# Patient Record
Sex: Male | Born: 1955 | Race: White | Hispanic: No | Marital: Married | Smoking: Current some day smoker
Health system: Southern US, Community
[De-identification: ages and names within clinical notes are randomized; demographics above are authoritative.]

## PROBLEM LIST (undated history)

## (undated) DIAGNOSIS — I10 Essential (primary) hypertension: Secondary | ICD-10-CM

## (undated) DIAGNOSIS — F419 Anxiety disorder, unspecified: Secondary | ICD-10-CM

## (undated) DIAGNOSIS — F329 Major depressive disorder, single episode, unspecified: Secondary | ICD-10-CM

## (undated) DIAGNOSIS — J449 Chronic obstructive pulmonary disease, unspecified: Secondary | ICD-10-CM

## (undated) DIAGNOSIS — F32A Depression, unspecified: Secondary | ICD-10-CM

## (undated) DIAGNOSIS — E119 Type 2 diabetes mellitus without complications: Secondary | ICD-10-CM

## (undated) DIAGNOSIS — J45909 Unspecified asthma, uncomplicated: Secondary | ICD-10-CM

## (undated) DIAGNOSIS — I219 Acute myocardial infarction, unspecified: Secondary | ICD-10-CM

## (undated) DIAGNOSIS — K859 Acute pancreatitis without necrosis or infection, unspecified: Secondary | ICD-10-CM

## (undated) HISTORY — PX: HERNIA REPAIR: SHX51

## (undated) HISTORY — PX: NECK SURGERY: SHX720

## (undated) HISTORY — PX: SALIVARY GLAND SURGERY: SHX768

---

## 2002-08-07 ENCOUNTER — Encounter: Payer: Self-pay | Admitting: Unknown Physician Specialty

## 2002-08-07 ENCOUNTER — Encounter: Admission: RE | Admit: 2002-08-07 | Discharge: 2002-08-07 | Payer: Self-pay | Admitting: Unknown Physician Specialty

## 2004-11-26 ENCOUNTER — Ambulatory Visit: Payer: Self-pay | Admitting: Internal Medicine

## 2005-11-17 ENCOUNTER — Other Ambulatory Visit: Payer: Self-pay

## 2005-11-17 ENCOUNTER — Emergency Department: Payer: Self-pay | Admitting: Internal Medicine

## 2007-06-08 ENCOUNTER — Encounter: Payer: Self-pay | Admitting: Unknown Physician Specialty

## 2007-06-17 ENCOUNTER — Encounter: Payer: Self-pay | Admitting: Unknown Physician Specialty

## 2007-07-11 ENCOUNTER — Encounter: Admission: RE | Admit: 2007-07-11 | Discharge: 2007-07-11 | Payer: Self-pay | Admitting: Unknown Physician Specialty

## 2007-07-18 ENCOUNTER — Encounter: Payer: Self-pay | Admitting: Unknown Physician Specialty

## 2008-07-04 ENCOUNTER — Emergency Department: Payer: Self-pay | Admitting: Emergency Medicine

## 2008-07-10 ENCOUNTER — Emergency Department: Payer: Self-pay | Admitting: Emergency Medicine

## 2009-03-13 ENCOUNTER — Inpatient Hospital Stay: Payer: Self-pay | Admitting: Unknown Physician Specialty

## 2010-11-27 ENCOUNTER — Inpatient Hospital Stay: Payer: Self-pay | Admitting: Psychiatry

## 2010-11-27 DIAGNOSIS — R079 Chest pain, unspecified: Secondary | ICD-10-CM

## 2011-01-20 ENCOUNTER — Emergency Department: Payer: Self-pay | Admitting: *Deleted

## 2012-03-01 ENCOUNTER — Ambulatory Visit: Payer: Self-pay | Admitting: Unknown Physician Specialty

## 2012-03-03 LAB — PATHOLOGY REPORT

## 2014-04-03 DIAGNOSIS — I10 Essential (primary) hypertension: Secondary | ICD-10-CM | POA: Insufficient documentation

## 2014-04-03 DIAGNOSIS — K219 Gastro-esophageal reflux disease without esophagitis: Secondary | ICD-10-CM | POA: Insufficient documentation

## 2014-04-03 DIAGNOSIS — F419 Anxiety disorder, unspecified: Secondary | ICD-10-CM | POA: Insufficient documentation

## 2014-04-03 DIAGNOSIS — E119 Type 2 diabetes mellitus without complications: Secondary | ICD-10-CM | POA: Insufficient documentation

## 2014-06-20 ENCOUNTER — Ambulatory Visit: Payer: Self-pay | Admitting: Orthopedic Surgery

## 2015-06-12 DIAGNOSIS — F3340 Major depressive disorder, recurrent, in remission, unspecified: Secondary | ICD-10-CM | POA: Insufficient documentation

## 2018-01-26 ENCOUNTER — Encounter: Payer: Self-pay | Admitting: Emergency Medicine

## 2018-01-26 ENCOUNTER — Emergency Department
Admission: EM | Admit: 2018-01-26 | Discharge: 2018-01-26 | Disposition: A | Discharge status: Home / Self Care | Payer: Medicare HMO | Attending: Student in an Organized Health Care Education/Training Program | Admitting: Student in an Organized Health Care Education/Training Program

## 2018-01-26 ENCOUNTER — Emergency Department: Payer: Medicare HMO

## 2018-01-26 ENCOUNTER — Other Ambulatory Visit: Payer: Self-pay

## 2018-01-26 DIAGNOSIS — R1011 Right upper quadrant pain: Secondary | ICD-10-CM | POA: Diagnosis not present

## 2018-01-26 DIAGNOSIS — Z87891 Personal history of nicotine dependence: Secondary | ICD-10-CM | POA: Insufficient documentation

## 2018-01-26 DIAGNOSIS — J449 Chronic obstructive pulmonary disease, unspecified: Secondary | ICD-10-CM | POA: Insufficient documentation

## 2018-01-26 DIAGNOSIS — K852 Alcohol induced acute pancreatitis without necrosis or infection: Secondary | ICD-10-CM | POA: Insufficient documentation

## 2018-01-26 DIAGNOSIS — E119 Type 2 diabetes mellitus without complications: Secondary | ICD-10-CM | POA: Diagnosis not present

## 2018-01-26 DIAGNOSIS — R1013 Epigastric pain: Secondary | ICD-10-CM | POA: Diagnosis present

## 2018-01-26 DIAGNOSIS — I10 Essential (primary) hypertension: Secondary | ICD-10-CM | POA: Diagnosis not present

## 2018-01-26 HISTORY — DX: Depression, unspecified: F32.A

## 2018-01-26 HISTORY — DX: Chronic obstructive pulmonary disease, unspecified: J44.9

## 2018-01-26 HISTORY — DX: Type 2 diabetes mellitus without complications: E11.9

## 2018-01-26 HISTORY — DX: Anxiety disorder, unspecified: F41.9

## 2018-01-26 HISTORY — DX: Major depressive disorder, single episode, unspecified: F32.9

## 2018-01-26 HISTORY — DX: Unspecified asthma, uncomplicated: J45.909

## 2018-01-26 HISTORY — DX: Essential (primary) hypertension: I10

## 2018-01-26 LAB — URINALYSIS, COMPLETE (UACMP) WITH MICROSCOPIC
BILIRUBIN URINE: NEGATIVE
Bacteria, UA: NONE SEEN
GLUCOSE, UA: NEGATIVE mg/dL
HGB URINE DIPSTICK: NEGATIVE
KETONES UR: 5 mg/dL — AB
LEUKOCYTES UA: NEGATIVE
NITRITE: NEGATIVE
PH: 5 (ref 5.0–8.0)
Protein, ur: NEGATIVE mg/dL
Specific Gravity, Urine: 1.024 (ref 1.005–1.030)
Squamous Epithelial / LPF: NONE SEEN (ref 0–5)

## 2018-01-26 LAB — CBC
HCT: 43 % (ref 40.0–52.0)
HEMOGLOBIN: 14.9 g/dL (ref 13.0–18.0)
MCH: 32.9 pg (ref 26.0–34.0)
MCHC: 34.7 g/dL (ref 32.0–36.0)
MCV: 94.8 fL (ref 80.0–100.0)
PLATELETS: 231 10*3/uL (ref 150–440)
RBC: 4.54 MIL/uL (ref 4.40–5.90)
RDW: 13.6 % (ref 11.5–14.5)
WBC: 11.3 10*3/uL — ABNORMAL HIGH (ref 3.8–10.6)

## 2018-01-26 LAB — COMPREHENSIVE METABOLIC PANEL
ALT: 24 U/L (ref 0–44)
AST: 24 U/L (ref 15–41)
Albumin: 4.4 g/dL (ref 3.5–5.0)
Alkaline Phosphatase: 62 U/L (ref 38–126)
Anion gap: 10 (ref 5–15)
BILIRUBIN TOTAL: 0.7 mg/dL (ref 0.3–1.2)
BUN: 14 mg/dL (ref 6–20)
CHLORIDE: 103 mmol/L (ref 98–111)
CO2: 25 mmol/L (ref 22–32)
Calcium: 9.6 mg/dL (ref 8.9–10.3)
Creatinine, Ser: 0.86 mg/dL (ref 0.61–1.24)
GFR calc Af Amer: >60 mL/min (ref >60)
GFR calc non Af Amer: >60 mL/min (ref >60)
GLUCOSE: 114 mg/dL — AB (ref 70–99)
POTASSIUM: 4.2 mmol/L (ref 3.5–5.1)
SODIUM: 138 mmol/L (ref 135–145)
TOTAL PROTEIN: 7.7 g/dL (ref 6.5–8.1)

## 2018-01-26 LAB — LIPASE, BLOOD: Lipase: 214 U/L — ABNORMAL HIGH (ref 11–51)

## 2018-01-26 MED ORDER — MORPHINE SULFATE (PF) 4 MG/ML IV SOLN
4.0000 mg | INTRAVENOUS | Status: DC | PRN
  Administered 2018-01-26: 4 mg via INTRAVENOUS
  Filled 2018-01-26: qty 1

## 2018-01-26 MED ORDER — IOHEXOL 300 MG/ML  SOLN
100.0000 mL | Freq: Once | INTRAMUSCULAR | Status: AC | PRN
  Administered 2018-01-26: 100 mL via INTRAVENOUS
  Filled 2018-01-26: qty 100

## 2018-01-26 MED ORDER — HYDROCODONE-ACETAMINOPHEN 5-325 MG PO TABS: 1.0000 | ORAL_TABLET | ORAL | 0 refills | Status: DC | PRN

## 2018-01-26 MED ORDER — PROMETHAZINE HCL 25 MG/ML IJ SOLN
12.5000 mg | Freq: Four times a day (QID) | INTRAMUSCULAR | Status: DC | PRN
  Administered 2018-01-26: 12.5 mg via INTRAVENOUS
  Filled 2018-01-26: qty 1

## 2018-01-26 MED ORDER — SODIUM CHLORIDE 0.9 % IV BOLUS
1000.0000 mL | Freq: Once | INTRAVENOUS | Status: AC
  Administered 2018-01-26: 1000 mL via INTRAVENOUS

## 2018-01-26 MED ORDER — PROCHLORPERAZINE MALEATE 10 MG PO TABS: 10.0000 mg | ORAL_TABLET | Freq: Four times a day (QID) | ORAL | 0 refills | Status: DC | PRN

## 2018-01-26 NOTE — ED Notes (Signed)
Patient transported to CT 

## 2018-01-26 NOTE — ED Provider Notes (Signed)
[facility] Emergency Department Provider Note    First MD Initiated Contact with Patient 01/26/18 1849     (approximate)  I have reviewed the triage vital signs and the nursing notes.   HISTORY  Chief Complaint Abdominal Pain and Back Pain    HPI Joshua Delacruz is a 62 y.o. male with below listed past medical history as well as history of alcohol use dependence presents the ER with chief complaint of midepigastric pain radiating to the right upper quadrant and right side of his back over the past several days.  Denies any fevers.  Does have nausea with this but has not been vomiting.  States his symptoms were not getting any better so he decided to come to the ER tonight.  He is been taking meloxicam at home without any improvement.  Denies any chest pain or shortness of breath.  No fevers.  The pain is mild to moderate in severity.    Past Medical History:  Diagnosis Date  . Anxiety   . Asthma   . COPD (chronic obstructive pulmonary disease) (HCC)   . Depression   . Diabetes mellitus without complication (HCC)   . Hypertension    No family history on file. Past Surgical History:  Procedure Laterality Date  . HERNIA REPAIR     There are no active problems to display for this patient.     Prior to Admission medications   Not on File    Allergies Patient has no known allergies.    Social History Social History   Tobacco Use  . Smoking status: Former Games developer  . Smokeless tobacco: Never Used  Substance Use Topics  . Alcohol use: Yes    Comment: occasional  . Drug use: Yes    Types: Marijuana    Review of Systems Patient denies headaches, rhinorrhea, blurry vision, numbness, shortness of breath, chest pain, edema, cough, abdominal pain, nausea, vomiting, diarrhea, dysuria, fevers, rashes or hallucinations unless otherwise stated above in HPI. ____________________________________________   PHYSICAL EXAM:  VITAL SIGNS: Vitals:     01/26/18 1711 01/26/18 1851  BP: (!) 153/103 (!) 154/99  Pulse: 83 74  Resp: 18 18  Temp: 98.3 F (36.8 C)   SpO2: 97% 98%    Constitutional: Alert and oriented.  Eyes: Conjunctivae are normal.  Head: Atraumatic. Nose: No congestion/rhinnorhea. Mouth/Throat: Mucous membranes are moist.   Neck: No stridor. Painless ROM.  Cardiovascular: Normal rate, regular rhythm. Grossly normal heart sounds.  Good peripheral circulation. Respiratory: Normal respiratory effort.  No retractions. Lungs CTAB. Gastrointestinal: Soft and nontender. No distention. No abdominal bruits. No CVA tenderness. Genitourinary:  Musculoskeletal: No lower extremity tenderness nor edema.  No joint effusions. Neurologic:  Normal speech and language. No gross focal neurologic deficits are appreciated. No facial droop Skin:  Skin is warm, dry and intact. No rash noted. Psychiatric: Mood and affect are normal. Speech and behavior are normal.  ____________________________________________   LABS (all labs ordered are listed, but only abnormal results are displayed)  Results for orders placed or performed during the hospital encounter of 01/26/18 (from the past 24 hour(s))  Lipase, blood     Status: Abnormal   Collection Time: 01/26/18  5:21 PM  Result Value Ref Range   Lipase 214 (H) 11 - 51 U/L  Comprehensive metabolic panel     Status: Abnormal   Collection Time: 01/26/18  5:21 PM  Result Value Ref Range   Sodium 138 135 - 145 mmol/L  Potassium 4.2 3.5 - 5.1 mmol/L   Chloride 103 98 - 111 mmol/L   CO2 25 22 - 32 mmol/L   Glucose, Bld 114 (H) 70 - 99 mg/dL   BUN 14 6 - 20 mg/dL   Creatinine, Ser 1.610.86 0.61 - 1.24 mg/dL   Calcium 9.6 8.9 - 09.610.3 mg/dL   Total Protein 7.7 6.5 - 8.1 g/dL   Albumin 4.4 3.5 - 5.0 g/dL   AST 24 15 - 41 U/L   ALT 24 0 - 44 U/L   Alkaline Phosphatase 62 38 - 126 U/L   Total Bilirubin 0.7 0.3 - 1.2 mg/dL   GFR calc non Af Amer >60 >60 mL/min   GFR calc Af Amer >60 >60 mL/min    Anion gap 10 5 - 15  CBC     Status: Abnormal   Collection Time: 01/26/18  5:21 PM  Result Value Ref Range   WBC 11.3 (H) 3.8 - 10.6 K/uL   RBC 4.54 4.40 - 5.90 MIL/uL   Hemoglobin 14.9 13.0 - 18.0 g/dL   HCT 04.543.0 40.940.0 - 81.152.0 %   MCV 94.8 80.0 - 100.0 fL   MCH 32.9 26.0 - 34.0 pg   MCHC 34.7 32.0 - 36.0 g/dL   RDW 91.413.6 78.211.5 - 95.614.5 %   Platelets 231 150 - 440 K/uL  Urinalysis, Complete w Microscopic     Status: Abnormal   Collection Time: 01/26/18  5:21 PM  Result Value Ref Range   Color, Urine YELLOW (A) YELLOW   APPearance CLEAR (A) CLEAR   Specific Gravity, Urine 1.024 1.005 - 1.030   pH 5.0 5.0 - 8.0   Glucose, UA NEGATIVE NEGATIVE mg/dL   Hgb urine dipstick NEGATIVE NEGATIVE   Bilirubin Urine NEGATIVE NEGATIVE   Ketones, ur 5 (A) NEGATIVE mg/dL   Protein, ur NEGATIVE NEGATIVE mg/dL   Nitrite NEGATIVE NEGATIVE   Leukocytes, UA NEGATIVE NEGATIVE   RBC / HPF 0-5 0 - 5 RBC/hpf   WBC, UA 0-5 0 - 5 WBC/hpf   Bacteria, UA NONE SEEN NONE SEEN   Squamous Epithelial / LPF NONE SEEN 0 - 5   Mucus PRESENT    ____________________________________________ ____________________________________________  RADIOLOGY  I personally reviewed all radiographic images ordered to evaluate for the above acute complaints and reviewed radiology reports and findings.  These findings were personally discussed with the patient.  Please see medical record for radiology report.  ____________________________________________   PROCEDURES  Procedure(s) performed:  Procedures    Critical Care performed: no ____________________________________________   INITIAL IMPRESSION / ASSESSMENT AND PLAN / ED COURSE  Pertinent labs & imaging results that were available during my care of the patient were reviewed by me and considered in my medical decision making (see chart for details).   DDX: Pancreatitis, gallstone pancreatitis, cholelithiasis, gastritis, dehydration, colitis  Joshua Delacruz is a 62  y.o. who presents to the ED with symptoms as described above.  Patient is AFVSS in ED. Exam as above. Given current presentation have considered the above differential.  Blood work does show evidence of elevated lipase and given his alcohol use I am concerned for pancreatitis therefore CT imaging will be ordered for the above differential.  Will provide IV fluids as well as IV pain medication  Clinical Course as of Jan 27 2147  Tue Jan 26, 2018  2124 dsicussed results of blood work as well as CT imaging with patient.  States that his pain is improving would like to try something  to eat or drink.   [PR]  2147 Patient reassessed.  He is tolerating oral hydration.  States his pain is controlled.  At this point do believe he stable and appropriate for outpatient follow-up.  We discussed signs and symptoms for which he should return immediately to hospital.Have discussed with the patient and available family all diagnostics and treatments performed thus far and all questions were answered to the best of my ability. The patient demonstrates understanding and agreement with plan.    [PR]    Clinical Course User Index [PR] Willy Eddyobinson, Veola Cafaro, MD     As part of my medical decision making, I reviewed the following data within the electronic MEDICAL RECORD NUMBER Nursing notes reviewed and incorporated, Labs reviewed, notes from prior ED visits.  ____________________________________________   FINAL CLINICAL IMPRESSION(S) / ED DIAGNOSES  Final diagnoses:  Alcohol-induced acute pancreatitis without infection or necrosis      NEW MEDICATIONS STARTED DURING THIS VISIT:  New Prescriptions   No medications on file     Note:  This document was prepared using Dragon voice recognition software and may include unintentional dictation errors.    Willy Eddyobinson, Yuriko Portales, MD 01/26/18 2152

## 2018-01-26 NOTE — ED Triage Notes (Signed)
Patient reports abdominal pain and back pain with nausea x3 days. Denies vomiting, diarrhea. Patients reports increased frequency of urination. Denies history of kidney stones

## 2018-01-26 NOTE — ED Notes (Signed)
Pt given sandwich tray and diet lemon lime shasta, per request for PO challenge from EDP.

## 2018-01-26 NOTE — Discharge Instructions (Addendum)

## 2018-01-26 NOTE — ED Notes (Signed)
ED Provider at bedside. 

## 2018-01-28 ENCOUNTER — Telehealth: Payer: Self-pay | Admitting: Gastroenterology

## 2018-01-28 NOTE — Telephone Encounter (Signed)
LVM for patient to call and schedule an ED follow up with Dr. Allegra LaiVanga.

## 2018-02-14 DIAGNOSIS — K760 Fatty (change of) liver, not elsewhere classified: Secondary | ICD-10-CM | POA: Insufficient documentation

## 2018-02-14 DIAGNOSIS — I700 Atherosclerosis of aorta: Secondary | ICD-10-CM | POA: Insufficient documentation

## 2018-02-17 ENCOUNTER — Other Ambulatory Visit: Payer: Self-pay | Admitting: Student

## 2018-02-17 DIAGNOSIS — K859 Acute pancreatitis without necrosis or infection, unspecified: Secondary | ICD-10-CM

## 2018-02-17 DIAGNOSIS — R748 Abnormal levels of other serum enzymes: Secondary | ICD-10-CM

## 2018-03-01 ENCOUNTER — Ambulatory Visit
Admission: RE | Admit: 2018-03-01 | Discharge: 2018-03-01 | Disposition: A | Discharge status: Home / Self Care | Payer: Medicare HMO | Source: Ambulatory Visit | Attending: Student | Admitting: Student

## 2018-03-01 ENCOUNTER — Other Ambulatory Visit: Payer: Self-pay | Admitting: Student

## 2018-03-01 DIAGNOSIS — R748 Abnormal levels of other serum enzymes: Secondary | ICD-10-CM | POA: Insufficient documentation

## 2018-03-01 DIAGNOSIS — K859 Acute pancreatitis without necrosis or infection, unspecified: Secondary | ICD-10-CM | POA: Diagnosis present

## 2018-03-01 DIAGNOSIS — K760 Fatty (change of) liver, not elsewhere classified: Secondary | ICD-10-CM | POA: Insufficient documentation

## 2018-03-01 MED ORDER — GADOBENATE DIMEGLUMINE 529 MG/ML IV SOLN
20.0000 mL | Freq: Once | INTRAVENOUS | Status: AC | PRN
  Administered 2018-03-01: 18 mL via INTRAVENOUS

## 2019-02-23 DIAGNOSIS — Z0000 Encounter for general adult medical examination without abnormal findings: Secondary | ICD-10-CM | POA: Insufficient documentation

## 2019-02-23 DIAGNOSIS — J45909 Unspecified asthma, uncomplicated: Secondary | ICD-10-CM | POA: Insufficient documentation

## 2020-06-20 IMAGING — CT CT ABD-PELV W/ CM
2 of 5 series · 15 of 46 positions shown, 17 images · IV contrast (APPLIED)
Comparison: None.

CLINICAL DATA: Abdominal pain with nausea.  Dysuria

EXAM:
CT ABDOMEN AND PELVIS WITH CONTRAST
TECHNIQUE: Multidetector CT imaging of the abdomen and pelvis was performed
using the standard protocol following bolus administration of
intravenous contrast.
CONTRAST:  100mL OMNIPAQUE IOHEXOL 300 MG/ML  SOLN

[Series 2: axial st · axial · 0.76mm/px · z∈[-568,-93]mm · 12 of 109 slices shown, 14 images]
[im 7/109  soft-tissue]
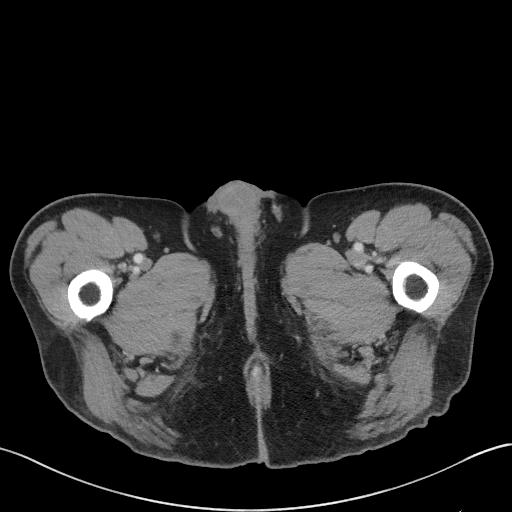
[im 7/109  bone]
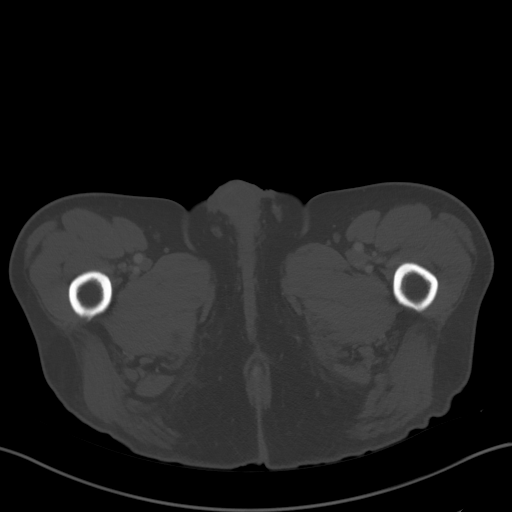
[im 20/109  soft-tissue]
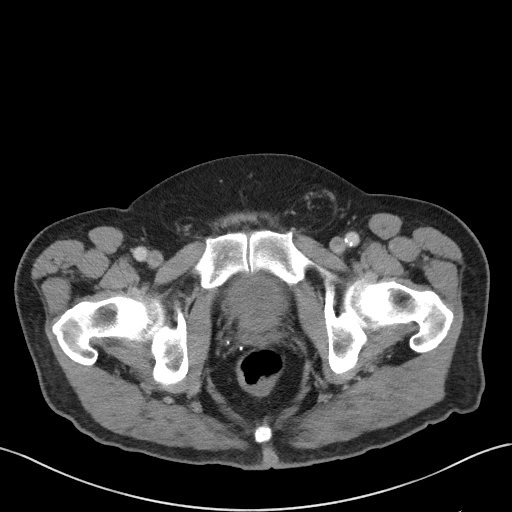
[im 26/109  soft-tissue]
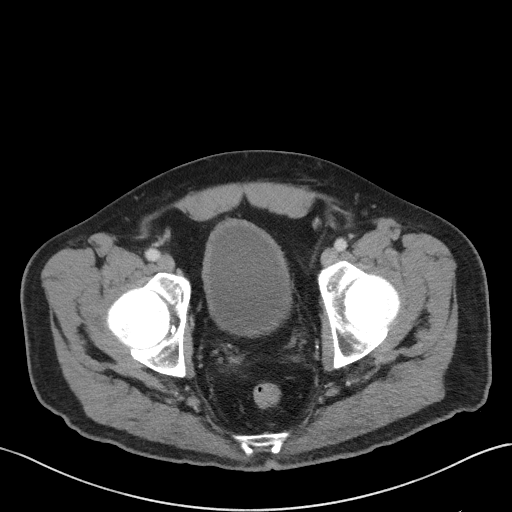
[im 32/109  soft-tissue]
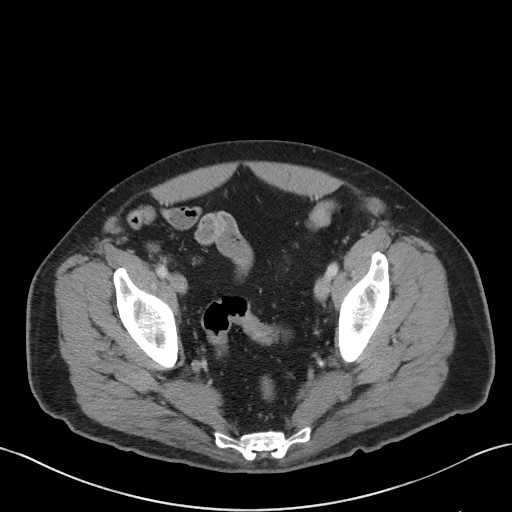
[im 45/109  soft-tissue]
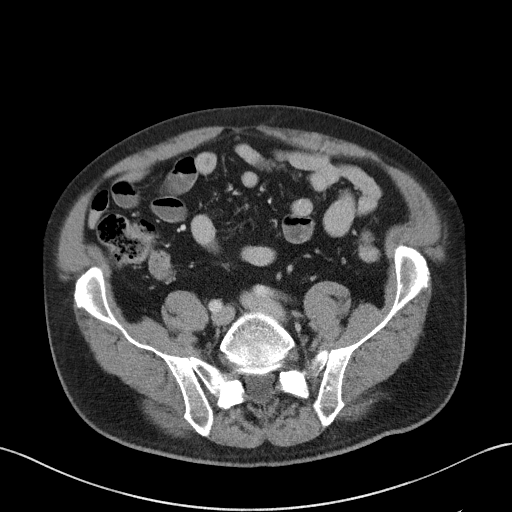
[im 51/109  soft-tissue]
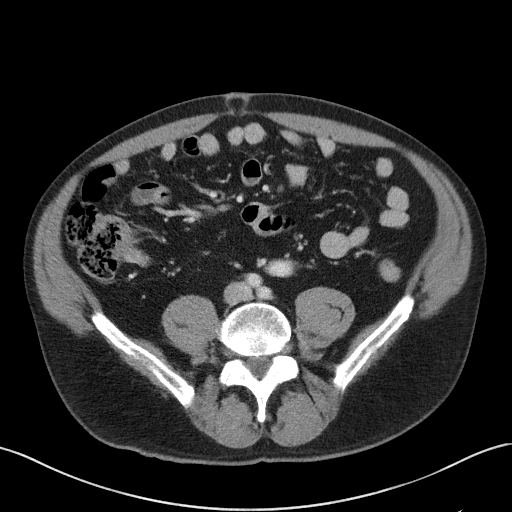
[im 58/109  soft-tissue]
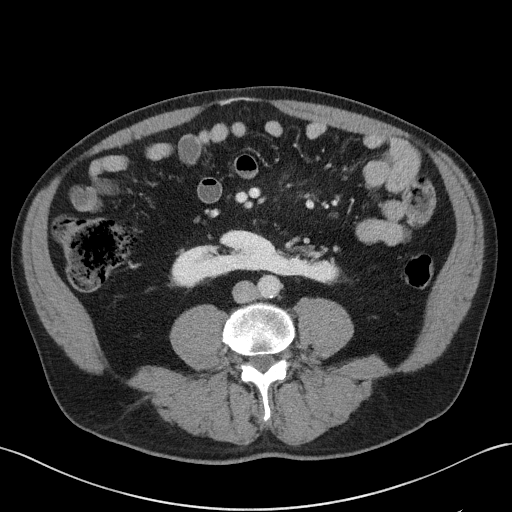
[im 70/109  soft-tissue]
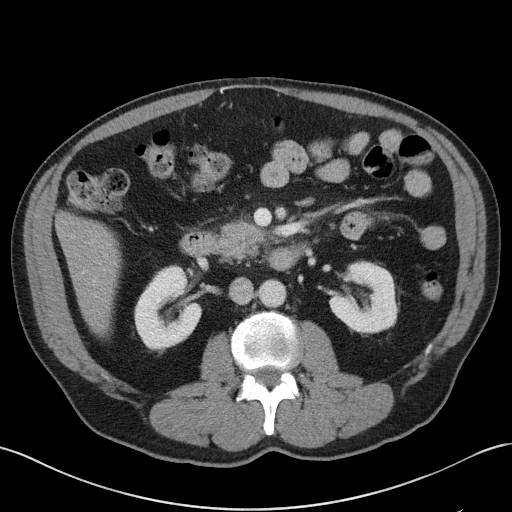
[im 77/109  soft-tissue]
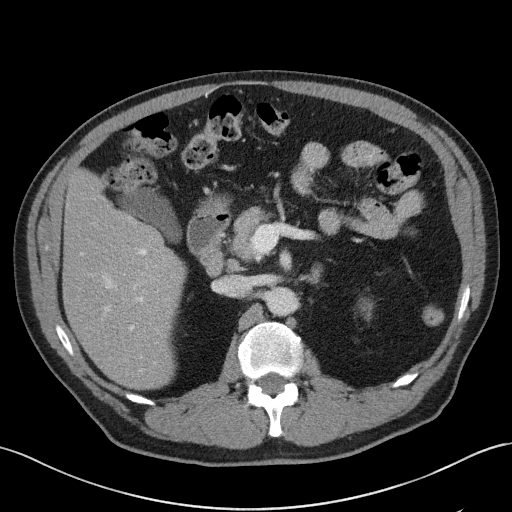
[im 77/109  bone]
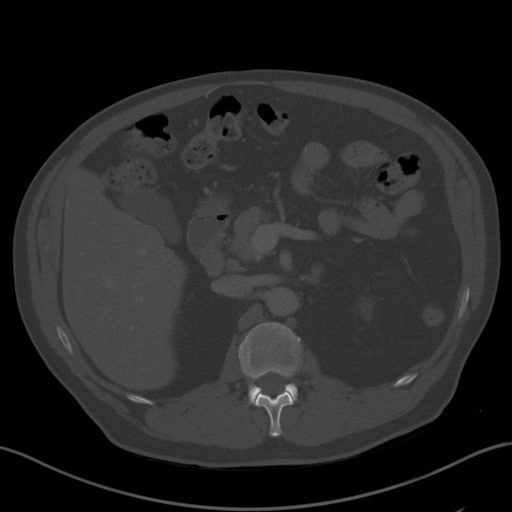
[im 83/109  soft-tissue]
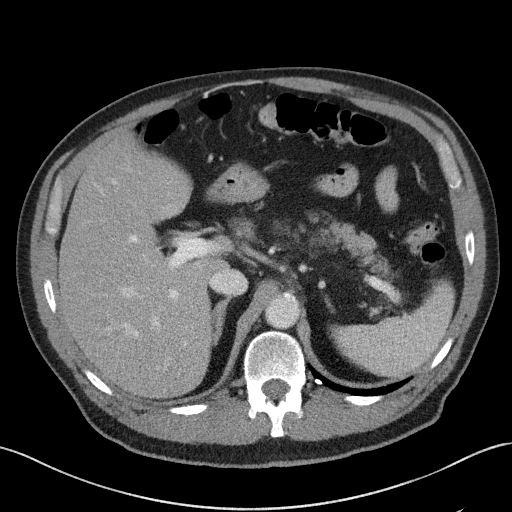
[im 96/109  soft-tissue]
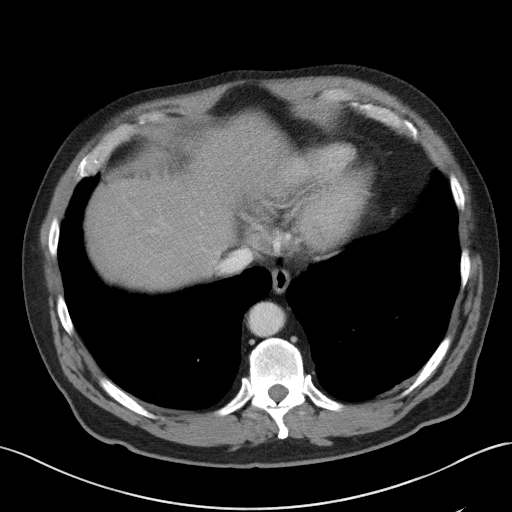
[im 102/109  soft-tissue]
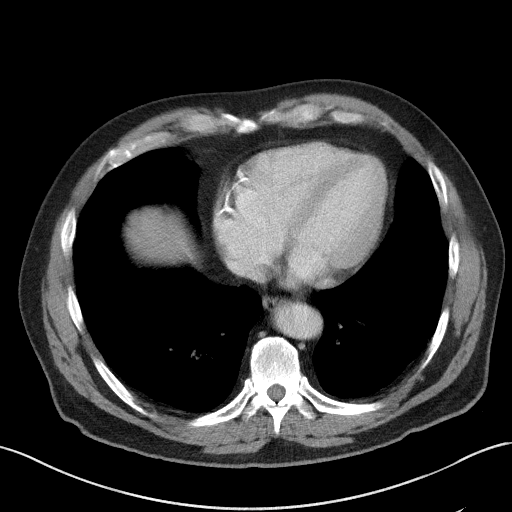

[Series 5: coronal st · coronal · 0.73mm/px · 3 of 88 slices shown]
[im 30/88  soft-tissue]
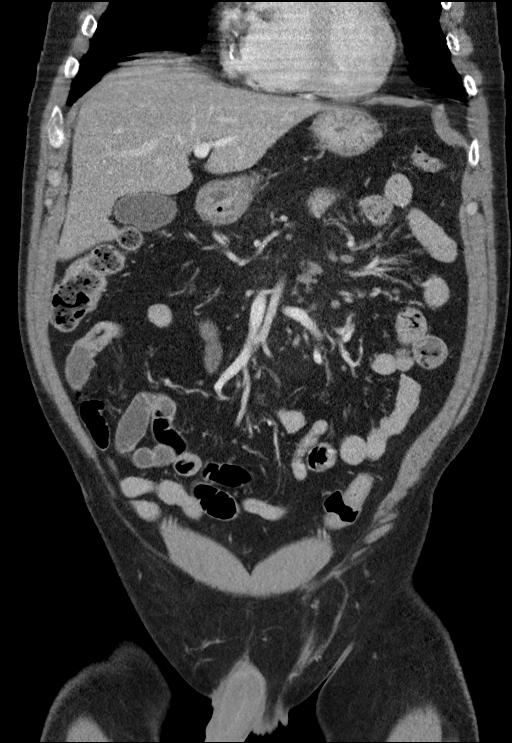
[im 39/88  soft-tissue]
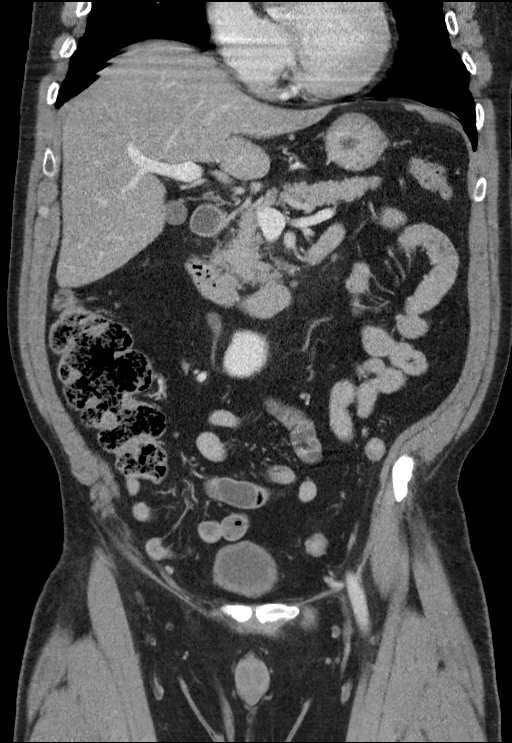
[im 49/88  soft-tissue]
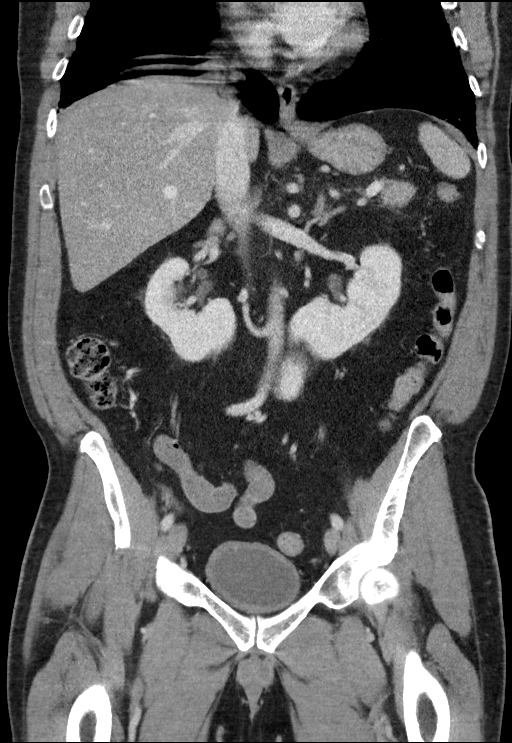

[15 of 46 positions shown; findings below may reference images not displayed]

FINDINGS: Lower chest: There is scarring in the lung bases. No lung base edema
or consolidation. On axial slice 22 series 4, there is a 5 mm
nodular opacity in the posterior segment of the right lower lobe.

Hepatobiliary: There is hepatic steatosis. No focal liver lesions
are apparent. Gallbladder wall is not appreciably thickened. There
is no biliary duct dilatation.

Pancreas: No pancreatic mass or inflammatory focus.

Spleen: No splenic lesions are evident.

Adrenals/Urinary Tract: Adrenals bilaterally appear unremarkable.
There is a horseshoe kidney. There is no evident renal mass or
hydronephrosis involving either moiety. There is no renal or
ureteral calculus in either moiety. Urinary bladder is midline with
wall thickness within normal limits for degree of distention.

Stomach/Bowel: There are scattered sigmoid diverticula without
diverticulitis. There is no evident bowel wall or mesenteric
thickening. There is no appreciable bowel obstruction. There is no
free air or portal venous air.

Vascular/Lymphatic: There is mild aortic atherosclerosis. No
aneurysm evident. Major mesenteric arterial vessels appear patent.
No adenopathy is appreciable in the abdomen or pelvis.

Reproductive: Prostate and seminal vesicles are normal in size and
contour. No evident pelvic mass.

Other: Appendix appears diminutive but unremarkable. No abscess or
ascites is evident in the abdomen pelvis. There is a small ventral
hernia containing only fat. There is a fat containing inguinal
hernia on each side, larger on the left than on the right.

Musculoskeletal: There are no blastic or lytic bone lesions. There
is no intramuscular or abdominal wall lesion.
IMPRESSION: 1. Horseshoe kidney without hydronephrosis involving either moiety.
No renal or ureteral calculi evident.

2. No evident bowel obstruction. There are sigmoid diverticula
without diverticulitis. No abscess in the abdomen pelvis. No
appendiceal region inflammatory change.

3. Small ventral hernia containing only fat. Inguinal hernias
bilaterally, larger on the left than on the right, containing only
fat.

4.  Mild aortic atherosclerosis.

5.  Hepatic steatosis.

6. 5 mm nodular opacity posterior right base. No follow-up needed if
patient is low-risk. Non-contrast chest CT can be considered in 12
months if patient is high-risk. This recommendation follows the
consensus statement: Guidelines for Management of Incidental
Pulmonary Nodules Detected on CT Images: From the [HOSPITAL]

Aortic Atherosclerosis ([id number]).

## 2021-11-20 ENCOUNTER — Ambulatory Visit: Payer: Medicare HMO | Admitting: Podiatry

## 2021-11-20 ENCOUNTER — Encounter: Payer: Self-pay | Admitting: Podiatry

## 2021-11-20 ENCOUNTER — Other Ambulatory Visit: Payer: Self-pay | Admitting: Podiatry

## 2021-11-20 ENCOUNTER — Ambulatory Visit (INDEPENDENT_AMBULATORY_CARE_PROVIDER_SITE_OTHER): Payer: Medicare HMO

## 2021-11-20 DIAGNOSIS — M722 Plantar fascial fibromatosis: Secondary | ICD-10-CM

## 2021-11-20 DIAGNOSIS — M79672 Pain in left foot: Secondary | ICD-10-CM

## 2021-11-20 MED ORDER — TRIAMCINOLONE ACETONIDE 40 MG/ML IJ SUSP
20.0000 mg | Freq: Once | INTRAMUSCULAR | Status: AC
  Administered 2021-11-20: 20 mg

## 2021-11-20 MED ORDER — MELOXICAM 15 MG PO TABS: 15.0000 mg | ORAL_TABLET | Freq: Every day | ORAL | 3 refills | Status: AC

## 2021-11-20 MED ORDER — METHYLPREDNISOLONE 4 MG PO TBPK: ORAL_TABLET | ORAL | 0 refills | Status: AC

## 2021-11-20 NOTE — Progress Notes (Signed)
Subjective:  Patient ID: Joshua Delacruz, male    DOB: 07-27-1955,  MRN: [id number] HPI Chief Complaint  Patient presents with   Foot Pain    Plantar heel left - aching x several months, AM pain, worn orthotics for flat feet for years-no longer wears now   New Patient (Initial Visit)   Diabetes    Last a1c was 7.4    66 y.o. male presents with the above complaint.   ROS: Denies fever chills nausea vomiting muscle aches pains calf pain back pain chest pain shortness of breath.  Past Medical History:  Diagnosis Date   Anxiety    Asthma    COPD (chronic obstructive pulmonary disease) (HCC)    Depression    Diabetes mellitus without complication (HCC)    Hypertension    Past Surgical History:  Procedure Laterality Date   HERNIA REPAIR      Current Outpatient Medications:    meloxicam (MOBIC) 15 MG tablet, Take 1 tablet (15 mg total) by mouth daily., Disp: 30 tablet, Rfl: 3   methylPREDNISolone (MEDROL DOSEPAK) 4 MG TBPK tablet, 6 day dose pack - take as directed, Disp: 21 tablet, Rfl: 0   albuterol (VENTOLIN HFA) 108 (90 Base) MCG/ACT inhaler, SMARTSIG:2 Puff(s) By Mouth Every 4 Hours PRN, Disp: , Rfl:    ANORO ELLIPTA 62.5-25 MCG/ACT AEPB, Inhale 1 puff into the lungs daily., Disp: , Rfl:    busPIRone (BUSPAR) 10 MG tablet, Take 10 mg by mouth 2 (two) times daily., Disp: , Rfl:    enalapril (VASOTEC) 10 MG tablet, Take 10 mg by mouth daily., Disp: , Rfl:    FLUoxetine (PROZAC) 40 MG capsule, Take 40 mg by mouth daily., Disp: , Rfl:    glimepiride (AMARYL) 1 MG tablet, Take 1 mg by mouth 2 (two) times daily., Disp: , Rfl:    metFORMIN (GLUCOPHAGE) 1000 MG tablet, Take 1,000 mg by mouth 2 (two) times daily., Disp: , Rfl:    omeprazole (PRILOSEC) 40 MG capsule, Take 40 mg by mouth at bedtime., Disp: , Rfl:    pravastatin (PRAVACHOL) 20 MG tablet, Take 20 mg by mouth at bedtime., Disp: , Rfl:   No Known Allergies Review of Systems Objective:  There were no vitals filed for  this visit.  General: Well developed, nourished, in no acute distress, alert and oriented x3   Dermatological: Skin is warm, dry and supple bilateral. Nails x 10 are well maintained; remaining integument appears unremarkable at this time. There are no open sores, no preulcerative lesions, no rash or signs of infection present.  Vascular: Dorsalis Pedis artery and Posterior Tibial artery pedal pulses are 2/4 bilateral with immedate capillary fill time. Pedal hair growth present. No varicosities and no lower extremity edema present bilateral.   Neruologic: Grossly intact via light touch bilateral. Vibratory intact via tuning fork bilateral. Protective threshold with Semmes Wienstein monofilament intact to all pedal sites bilateral. Patellar and Achilles deep tendon reflexes 2+ bilateral. No Babinski or clonus noted bilateral.   Musculoskeletal: No gross boney pedal deformities bilateral. No pain, crepitus, or limitation noted with foot and ankle range of motion bilateral. Muscular strength 5/5 in all groups tested bilateral.  Moderate to severe pain on palpation medial calcaneal tubercle of the left heel.  No pain on medial-lateral compression of the calcaneus.  Gait: Unassisted, Nonantalgic.    Radiographs:  Radiographs demonstrate an osseously mature individual small plantar distally oriented calcaneal heel spur with a soft tissue increase in density at the plantar  fascial calcaneal insertion site.  No other acute findings are identified.  Assessment & Plan:   Assessment: Planter fasciitis left.  Plan: Discussed etiology pathology conservative versus surgical therapy started him on methylprednisolone to be followed by meloxicam.  I injected the left heel 20 mg Kenalog 5 mg of Marcaine.  His wife has had this before so she brought her plantar fascia brace and a night splint and he will use those.  We did discuss appropriate shoe gear and stretching exercises as well.     Ashly Yepez T. [place],  [place]

## 2021-12-18 ENCOUNTER — Ambulatory Visit: Payer: Medicare HMO | Admitting: Podiatry

## 2022-05-01 ENCOUNTER — Other Ambulatory Visit: Payer: Self-pay | Admitting: Family Medicine

## 2022-05-01 DIAGNOSIS — I700 Atherosclerosis of aorta: Secondary | ICD-10-CM

## 2022-05-01 DIAGNOSIS — Z9189 Other specified personal risk factors, not elsewhere classified: Secondary | ICD-10-CM

## 2022-05-14 ENCOUNTER — Ambulatory Visit
Admission: RE | Admit: 2022-05-14 | Discharge: 2022-05-14 | Disposition: A | Discharge status: Home / Self Care | Payer: No Typology Code available for payment source | Source: Ambulatory Visit | Attending: Family Medicine | Admitting: Family Medicine

## 2022-05-14 DIAGNOSIS — Z9189 Other specified personal risk factors, not elsewhere classified: Secondary | ICD-10-CM

## 2022-05-14 DIAGNOSIS — I700 Atherosclerosis of aorta: Secondary | ICD-10-CM

## 2022-06-19 ENCOUNTER — Other Ambulatory Visit: Payer: Self-pay | Admitting: Internal Medicine

## 2022-06-19 DIAGNOSIS — I700 Atherosclerosis of aorta: Secondary | ICD-10-CM

## 2022-06-19 DIAGNOSIS — I2089 Other forms of angina pectoris: Secondary | ICD-10-CM

## 2022-06-19 DIAGNOSIS — R931 Abnormal findings on diagnostic imaging of heart and coronary circulation: Secondary | ICD-10-CM

## 2022-06-19 DIAGNOSIS — I10 Essential (primary) hypertension: Secondary | ICD-10-CM

## 2022-07-03 ENCOUNTER — Telehealth (HOSPITAL_COMMUNITY): Payer: Self-pay | Admitting: *Deleted

## 2022-07-03 MED ORDER — IVABRADINE HCL 7.5 MG PO TABS: ORAL_TABLET | ORAL | 0 refills | Status: AC

## 2022-07-03 MED ORDER — METOPROLOL TARTRATE 100 MG PO TABS: ORAL_TABLET | ORAL | 0 refills | Status: AC

## 2022-07-03 NOTE — Telephone Encounter (Signed)
Reaching out to patient to offer assistance regarding upcoming cardiac imaging study; pt verbalizes understanding of appt date/time, parking situation and where to check in, pre-test NPO status and medications ordered, and verified current allergies; name and call back number provided for further questions should they arise  Gordy Clement RN Navigator [facility] [phone] office [phone] cell  Patient to take 100mg  metoprolol tartrate and 15mg  ivabradine TWO hours prior to his cardiac CT scan.

## 2022-07-07 ENCOUNTER — Ambulatory Visit
Admission: RE | Admit: 2022-07-07 | Discharge: 2022-07-07 | Disposition: A | Discharge status: Home / Self Care | Payer: Medicare HMO | Source: Ambulatory Visit | Attending: Internal Medicine | Admitting: Internal Medicine

## 2022-07-07 ENCOUNTER — Other Ambulatory Visit: Payer: Self-pay | Admitting: Cardiology

## 2022-07-07 DIAGNOSIS — I700 Atherosclerosis of aorta: Secondary | ICD-10-CM | POA: Diagnosis present

## 2022-07-07 DIAGNOSIS — R931 Abnormal findings on diagnostic imaging of heart and coronary circulation: Secondary | ICD-10-CM

## 2022-07-07 DIAGNOSIS — I2089 Other forms of angina pectoris: Secondary | ICD-10-CM | POA: Insufficient documentation

## 2022-07-07 DIAGNOSIS — I10 Essential (primary) hypertension: Secondary | ICD-10-CM | POA: Diagnosis present

## 2022-07-07 DIAGNOSIS — I2510 Atherosclerotic heart disease of native coronary artery without angina pectoris: Secondary | ICD-10-CM | POA: Diagnosis not present

## 2022-07-07 MED ORDER — DILTIAZEM HCL 25 MG/5ML IV SOLN
5.0000 mg | Freq: Once | INTRAVENOUS | Status: AC
  Administered 2022-07-07: 5 mg via INTRAVENOUS

## 2022-07-07 MED ORDER — NITROGLYCERIN 0.4 MG SL SUBL
0.8000 mg | SUBLINGUAL_TABLET | Freq: Once | SUBLINGUAL | Status: AC
  Administered 2022-07-07: 0.8 mg via SUBLINGUAL
  Filled 2022-07-07: qty 25

## 2022-07-07 MED ORDER — METOPROLOL TARTRATE 5 MG/5ML IV SOLN
10.0000 mg | Freq: Once | INTRAVENOUS | Status: AC
  Administered 2022-07-07: 10 mg via INTRAVENOUS
  Filled 2022-07-07: qty 10

## 2022-07-07 MED ORDER — METOPROLOL TARTRATE 5 MG/5ML IV SOLN
10.0000 mg | Freq: Once | INTRAVENOUS | Status: AC
  Administered 2022-07-07: 10 mg via INTRAVENOUS

## 2022-07-07 MED ORDER — IOHEXOL 350 MG/ML SOLN
100.0000 mL | Freq: Once | INTRAVENOUS | Status: AC | PRN
  Administered 2022-07-07: 100 mL via INTRAVENOUS

## 2022-07-07 NOTE — Progress Notes (Signed)
Patient tolerated procedure well. Ambulate w/o difficulty. Denies any lightheadedness or being dizzy. Pt denies any pain at this time. Sitting in chair, pt is encouraged to drink additional water throughout the day and reason explained to patient. Patient verbalized understanding and all questions answered. ABC intact. No further needs at this time. Discharge from procedure area w/o issues.  

## 2022-07-22 ENCOUNTER — Other Ambulatory Visit
Admission: RE | Admit: 2022-07-22 | Discharge: 2022-07-22 | Disposition: A | Discharge status: Home / Self Care | Payer: Medicare HMO | Source: Ambulatory Visit | Attending: Internal Medicine | Admitting: Internal Medicine

## 2022-07-22 DIAGNOSIS — I10 Essential (primary) hypertension: Secondary | ICD-10-CM | POA: Diagnosis present

## 2022-07-22 DIAGNOSIS — I700 Atherosclerosis of aorta: Secondary | ICD-10-CM | POA: Insufficient documentation

## 2022-07-22 DIAGNOSIS — Z01810 Encounter for preprocedural cardiovascular examination: Secondary | ICD-10-CM | POA: Insufficient documentation

## 2022-07-22 DIAGNOSIS — I2089 Other forms of angina pectoris: Secondary | ICD-10-CM | POA: Diagnosis present

## 2022-07-22 LAB — BRAIN NATRIURETIC PEPTIDE: B Natriuretic Peptide: 13 pg/mL (ref 0.0–100.0)

## 2022-08-11 ENCOUNTER — Other Ambulatory Visit: Payer: Self-pay

## 2022-08-11 ENCOUNTER — Ambulatory Visit
Admission: RE | Admit: 2022-08-11 | Discharge: 2022-08-11 | Disposition: A | Discharge status: Home / Self Care | Payer: Medicare HMO | Attending: Internal Medicine | Admitting: Internal Medicine

## 2022-08-11 ENCOUNTER — Encounter: Payer: Self-pay | Admitting: Internal Medicine

## 2022-08-11 ENCOUNTER — Encounter
Admission: RE | Disposition: A | Discharge status: Home / Self Care | Payer: Self-pay | Source: Home / Self Care | Attending: Internal Medicine

## 2022-08-11 DIAGNOSIS — I2582 Chronic total occlusion of coronary artery: Secondary | ICD-10-CM | POA: Diagnosis not present

## 2022-08-11 DIAGNOSIS — R9430 Abnormal result of cardiovascular function study, unspecified: Secondary | ICD-10-CM | POA: Diagnosis present

## 2022-08-11 DIAGNOSIS — I25110 Atherosclerotic heart disease of native coronary artery with unstable angina pectoris: Secondary | ICD-10-CM | POA: Diagnosis not present

## 2022-08-11 HISTORY — PX: LEFT HEART CATH AND CORONARY ANGIOGRAPHY: CATH118249

## 2022-08-11 LAB — GLUCOSE, CAPILLARY
Glucose-Capillary: 192 mg/dL — ABNORMAL HIGH (ref 70–99)
Glucose-Capillary: 169 mg/dL — ABNORMAL HIGH (ref 70–99)

## 2022-08-11 LAB — CARDIAC CATHETERIZATION: Cath EF Quantitative: 60 %

## 2022-08-11 SURGERY — LEFT HEART CATH AND CORONARY ANGIOGRAPHY
Anesthesia: Moderate Sedation | Laterality: Left

## 2022-08-11 MED ORDER — VERAPAMIL HCL 2.5 MG/ML IV SOLN
INTRAVENOUS | Status: AC
  Filled 2022-08-11: qty 2

## 2022-08-11 MED ORDER — SODIUM CHLORIDE 0.9% FLUSH: 3.0000 mL | Freq: Two times a day (BID) | INTRAVENOUS | Status: DC

## 2022-08-11 MED ORDER — ACETAMINOPHEN 325 MG PO TABS: 650.0000 mg | ORAL_TABLET | ORAL | Status: DC | PRN

## 2022-08-11 MED ORDER — LABETALOL HCL 5 MG/ML IV SOLN
10.0000 mg | INTRAVENOUS | Status: DC | PRN
  Administered 2022-08-11: 10 mg via INTRAVENOUS

## 2022-08-11 MED ORDER — FENTANYL CITRATE (PF) 100 MCG/2ML IJ SOLN
INTRAMUSCULAR | Status: DC | PRN
  Administered 2022-08-11: 25 ug via INTRAVENOUS

## 2022-08-11 MED ORDER — SODIUM CHLORIDE 0.9 % IV SOLN: INTRAVENOUS | Status: DC

## 2022-08-11 MED ORDER — HYDRALAZINE HCL 20 MG/ML IJ SOLN: 10.0000 mg | INTRAMUSCULAR | Status: DC | PRN

## 2022-08-11 MED ORDER — MIDAZOLAM HCL 2 MG/2ML IJ SOLN
INTRAMUSCULAR | Status: AC
  Filled 2022-08-11: qty 2

## 2022-08-11 MED ORDER — VERAPAMIL HCL 2.5 MG/ML IV SOLN
INTRAVENOUS | Status: DC | PRN
  Administered 2022-08-11: 2.5 mg via INTRA_ARTERIAL

## 2022-08-11 MED ORDER — SODIUM CHLORIDE 0.9 % IV SOLN: 250.0000 mL | INTRAVENOUS | Status: DC | PRN

## 2022-08-11 MED ORDER — HEPARIN (PORCINE) IN NACL 1000-0.9 UT/500ML-% IV SOLN
INTRAVENOUS | Status: AC
  Filled 2022-08-11: qty 1000

## 2022-08-11 MED ORDER — ASPIRIN 81 MG PO CHEW
CHEWABLE_TABLET | ORAL | Status: AC
  Administered 2022-08-11: 81 mg via ORAL
  Filled 2022-08-11: qty 1

## 2022-08-11 MED ORDER — HEPARIN (PORCINE) IN NACL 1000-0.9 UT/500ML-% IV SOLN
INTRAVENOUS | Status: DC | PRN
  Administered 2022-08-11 (×2): 500 mL

## 2022-08-11 MED ORDER — FENTANYL CITRATE (PF) 100 MCG/2ML IJ SOLN
INTRAMUSCULAR | Status: AC
  Filled 2022-08-11: qty 2

## 2022-08-11 MED ORDER — ASPIRIN 81 MG PO CHEW: 81.0000 mg | CHEWABLE_TABLET | ORAL | Status: AC

## 2022-08-11 MED ORDER — SODIUM CHLORIDE 0.9% FLUSH: 3.0000 mL | INTRAVENOUS | Status: DC | PRN

## 2022-08-11 MED ORDER — HEPARIN SODIUM (PORCINE) 1000 UNIT/ML IJ SOLN
INTRAMUSCULAR | Status: AC
  Filled 2022-08-11: qty 10

## 2022-08-11 MED ORDER — ONDANSETRON HCL 4 MG/2ML IJ SOLN: 4.0000 mg | Freq: Four times a day (QID) | INTRAMUSCULAR | Status: DC | PRN

## 2022-08-11 MED ORDER — IOHEXOL 300 MG/ML  SOLN
INTRAMUSCULAR | Status: DC | PRN
  Administered 2022-08-11: 62 mL

## 2022-08-11 MED ORDER — MIDAZOLAM HCL 2 MG/2ML IJ SOLN
INTRAMUSCULAR | Status: DC | PRN
  Administered 2022-08-11: 1 mg via INTRAVENOUS

## 2022-08-11 MED ORDER — LABETALOL HCL 5 MG/ML IV SOLN
INTRAVENOUS | Status: AC
  Filled 2022-08-11: qty 4

## 2022-08-11 MED ORDER — SODIUM CHLORIDE 0.9 % WEIGHT BASED INFUSION: 1.0000 mL/kg/h | INTRAVENOUS | Status: DC

## 2022-08-11 MED ORDER — HEPARIN SODIUM (PORCINE) 1000 UNIT/ML IJ SOLN
INTRAMUSCULAR | Status: DC | PRN
  Administered 2022-08-11: 4500 [IU] via INTRAVENOUS

## 2022-08-11 SURGICAL SUPPLY — 11 items
CATH INFINITI 5 FR JL3.5 (CATHETERS) IMPLANT
CATH INFINITI JR4 5F (CATHETERS) IMPLANT
DEVICE RAD TR BAND REGULAR (VASCULAR PRODUCTS) IMPLANT
DRAPE BRACHIAL (DRAPES) IMPLANT
GLIDESHEATH SLEND SS 6F .021 (SHEATH) IMPLANT
GUIDEWIRE INQWIRE 1.5J.035X260 (WIRE) IMPLANT
INQWIRE 1.5J .035X260CM (WIRE) ×1
PACK CARDIAC CATH (CUSTOM PROCEDURE TRAY) ×1 IMPLANT
PROTECTION STATION PRESSURIZED (MISCELLANEOUS) ×1
SET ATX SIMPLICITY (MISCELLANEOUS) IMPLANT
STATION PROTECTION PRESSURIZED (MISCELLANEOUS) IMPLANT

## 2022-08-12 ENCOUNTER — Encounter: Payer: Self-pay | Admitting: Internal Medicine

## 2022-12-29 ENCOUNTER — Encounter: Payer: Self-pay | Admitting: Gastroenterology

## 2023-01-05 ENCOUNTER — Ambulatory Visit: Payer: Medicare HMO | Admitting: Certified Registered"

## 2023-01-05 ENCOUNTER — Encounter: Payer: Self-pay | Admitting: Gastroenterology

## 2023-01-05 ENCOUNTER — Other Ambulatory Visit: Payer: Self-pay

## 2023-01-05 ENCOUNTER — Encounter
Admission: RE | Disposition: A | Discharge status: Home / Self Care | Payer: Self-pay | Source: Ambulatory Visit | Attending: Gastroenterology

## 2023-01-05 ENCOUNTER — Ambulatory Visit
Admission: RE | Admit: 2023-01-05 | Discharge: 2023-01-05 | Disposition: A | Discharge status: Home / Self Care | Payer: Medicare HMO | Source: Ambulatory Visit | Attending: Gastroenterology | Admitting: Gastroenterology

## 2023-01-05 DIAGNOSIS — Q399 Congenital malformation of esophagus, unspecified: Secondary | ICD-10-CM | POA: Insufficient documentation

## 2023-01-05 DIAGNOSIS — K3189 Other diseases of stomach and duodenum: Secondary | ICD-10-CM | POA: Diagnosis not present

## 2023-01-05 DIAGNOSIS — I10 Essential (primary) hypertension: Secondary | ICD-10-CM | POA: Diagnosis not present

## 2023-01-05 DIAGNOSIS — Z1211 Encounter for screening for malignant neoplasm of colon: Secondary | ICD-10-CM | POA: Diagnosis not present

## 2023-01-05 DIAGNOSIS — E119 Type 2 diabetes mellitus without complications: Secondary | ICD-10-CM | POA: Diagnosis not present

## 2023-01-05 DIAGNOSIS — K224 Dyskinesia of esophagus: Secondary | ICD-10-CM | POA: Diagnosis not present

## 2023-01-05 DIAGNOSIS — J449 Chronic obstructive pulmonary disease, unspecified: Secondary | ICD-10-CM | POA: Diagnosis not present

## 2023-01-05 DIAGNOSIS — F32A Depression, unspecified: Secondary | ICD-10-CM | POA: Insufficient documentation

## 2023-01-05 DIAGNOSIS — F419 Anxiety disorder, unspecified: Secondary | ICD-10-CM | POA: Diagnosis not present

## 2023-01-05 DIAGNOSIS — I252 Old myocardial infarction: Secondary | ICD-10-CM | POA: Diagnosis not present

## 2023-01-05 DIAGNOSIS — K640 First degree hemorrhoids: Secondary | ICD-10-CM | POA: Diagnosis not present

## 2023-01-05 DIAGNOSIS — K219 Gastro-esophageal reflux disease without esophagitis: Secondary | ICD-10-CM | POA: Insufficient documentation

## 2023-01-05 DIAGNOSIS — Z83719 Family history of colon polyps, unspecified: Secondary | ICD-10-CM | POA: Insufficient documentation

## 2023-01-05 DIAGNOSIS — K2980 Duodenitis without bleeding: Secondary | ICD-10-CM | POA: Diagnosis not present

## 2023-01-05 DIAGNOSIS — K222 Esophageal obstruction: Secondary | ICD-10-CM | POA: Diagnosis not present

## 2023-01-05 HISTORY — PX: COLONOSCOPY WITH PROPOFOL: SHX5780

## 2023-01-05 HISTORY — DX: Acute myocardial infarction, unspecified: I21.9

## 2023-01-05 HISTORY — PX: ESOPHAGEAL DILATION: SHX303

## 2023-01-05 HISTORY — DX: Acute pancreatitis without necrosis or infection, unspecified: K85.90

## 2023-01-05 HISTORY — PX: ESOPHAGOGASTRODUODENOSCOPY (EGD) WITH PROPOFOL: SHX5813

## 2023-01-05 HISTORY — PX: BIOPSY: SHX5522

## 2023-01-05 LAB — GLUCOSE, CAPILLARY: Glucose-Capillary: 144 mg/dL — ABNORMAL HIGH (ref 70–99)

## 2023-01-05 SURGERY — COLONOSCOPY WITH PROPOFOL
Anesthesia: General | Site: Esophagus

## 2023-01-05 MED ORDER — DEXMEDETOMIDINE HCL IN NACL 80 MCG/20ML IV SOLN
INTRAVENOUS | Status: DC | PRN
  Administered 2023-01-05: 12 ug via INTRAVENOUS

## 2023-01-05 MED ORDER — GLYCOPYRROLATE 0.2 MG/ML IJ SOLN
INTRAMUSCULAR | Status: DC | PRN
  Administered 2023-01-05: .2 mg via INTRAVENOUS

## 2023-01-05 MED ORDER — SODIUM CHLORIDE 0.9 % IV SOLN: INTRAVENOUS | Status: DC

## 2023-01-05 MED ORDER — PROPOFOL 10 MG/ML IV BOLUS
INTRAVENOUS | Status: DC | PRN
  Administered 2023-01-05: 100 mg via INTRAVENOUS
  Administered 2023-01-05 (×3): 20 mg via INTRAVENOUS

## 2023-01-05 MED ORDER — LIDOCAINE HCL (CARDIAC) PF 100 MG/5ML IV SOSY
PREFILLED_SYRINGE | INTRAVENOUS | Status: DC | PRN
  Administered 2023-01-05: 100 mg via INTRAVENOUS

## 2023-01-05 MED ORDER — PROPOFOL 500 MG/50ML IV EMUL
INTRAVENOUS | Status: DC | PRN
  Administered 2023-01-05: 150 ug/kg/min via INTRAVENOUS

## 2023-01-05 MED ORDER — PROPOFOL 10 MG/ML IV BOLUS
INTRAVENOUS | Status: AC
  Filled 2023-01-05: qty 20

## 2023-01-05 NOTE — Anesthesia Procedure Notes (Signed)
Procedure Name: MAC Date/Time: 01/05/2023 2:20 PM  Performed by: Cheral Bay, CRNAPre-anesthesia Checklist: Patient identified, Emergency Drugs available, Suction available, Patient being monitored and Timeout performed Patient Re-evaluated:Patient Re-evaluated prior to induction Oxygen Delivery Method: Nasal cannula Induction Type: IV induction Placement Confirmation: positive ETCO2 and CO2 detector

## 2023-01-05 NOTE — Anesthesia Preprocedure Evaluation (Signed)
Anesthesia Evaluation  Patient identified by MRN, date of birth, ID band Patient awake    Reviewed: Allergy & Precautions, NPO status , Patient's Chart, lab work & pertinent test results  History of Anesthesia Complications Negative for: history of anesthetic complications  Airway Mallampati: III  TM Distance: <3 FB Neck ROM: full    Dental  (+) Chipped   Pulmonary shortness of breath and with exertion, asthma , COPD, Current Smoker and Patient abstained from smoking.   Pulmonary exam normal        Cardiovascular Exercise Tolerance: Good hypertension, (-) angina + Past MI  Normal cardiovascular exam     Neuro/Psych  PSYCHIATRIC DISORDERS      negative neurological ROS     GI/Hepatic Neg liver ROS,GERD  Controlled,,  Endo/Other  diabetes, Type 2    Renal/GU negative Renal ROS  negative genitourinary   Musculoskeletal   Abdominal   Peds  Hematology negative hematology ROS (+)   Anesthesia Other Findings Patient reports that they do not think that any food or pills are stuck in their throat at this time.  Past Medical History: No date: Acute pancreatitis     Comment:  H/O No date: Anxiety No date: Asthma No date: COPD (chronic obstructive pulmonary disease) (HCC) No date: Depression No date: Diabetes mellitus without complication (HCC) No date: Hypertension No date: Myocardial infarction [facility])  Past Surgical History: No date: HERNIA REPAIR No date: HERNIA REPAIR 08/11/2022: LEFT HEART CATH AND CORONARY ANGIOGRAPHY; Left     Comment:  Procedure: LEFT HEART CATH AND CORONARY ANGIOGRAPHY;                Surgeon: Alwyn Pea, MD;  Location: [facility] INVASIVE              CV LAB;  Service: Cardiovascular;  Laterality: Left; No date: NECK SURGERY No date: SALIVARY GLAND SURGERY  BMI    Body Mass Index: 24.79 kg/m      Reproductive/Obstetrics negative OB ROS                              Anesthesia Physical Anesthesia Plan  ASA: 3  Anesthesia Plan: General   Post-op Pain Management:    Induction: Intravenous  PONV Risk Score and Plan: Propofol infusion and TIVA  Airway Management Planned: Natural Airway and Nasal Cannula  Additional Equipment:   Intra-op Plan:   Post-operative Plan:   Informed Consent: I have reviewed the patients History and Physical, chart, labs and discussed the procedure including the risks, benefits and alternatives for the proposed anesthesia with the patient or authorized representative who has indicated his/her understanding and acceptance.     Dental Advisory Given  Plan Discussed with: Anesthesiologist, CRNA and Surgeon  Anesthesia Plan Comments: (Patient consented for risks of anesthesia including but not limited to:  - adverse reactions to medications - risk of airway placement if required - damage to eyes, teeth, lips or other oral mucosa - nerve damage due to positioning  - sore throat or hoarseness - Damage to heart, brain, nerves, lungs, other parts of body or loss of life  Patient voiced understanding.)       Anesthesia Quick Evaluation

## 2023-01-05 NOTE — Transfer of Care (Signed)
Immediate Anesthesia Transfer of Care Note  Patient: Joshua Delacruz  Procedure(s) Performed: COLONOSCOPY WITH PROPOFOL ESOPHAGOGASTRODUODENOSCOPY (EGD) WITH PROPOFOL BIOPSY  Patient Location: PACU  Anesthesia Type:General  Level of Consciousness: drowsy and responds to stimulation  Airway & Oxygen Therapy: Patient Spontanous Breathing  Post-op Assessment: Report given to RN and Post -op Vital signs reviewed and stable  Post vital signs: Reviewed and stable  Last Vitals:  Vitals Value Taken Time  BP 91/68 01/05/23 1456  Temp    Pulse 56 01/05/23 1457  Resp 17 01/05/23 1457  SpO2 98 % 01/05/23 1457  Vitals shown include unfiled device data.  Last Pain:  Vitals:   01/05/23 1341  TempSrc: Temporal  PainSc: 0-No pain         Complications: No notable events documented.

## 2023-01-05 NOTE — Interval H&P Note (Signed)
History and Physical Interval Note: Preprocedure H&P from 01/05/23  was reviewed and there was no interval change after seeing and examining the patient.  Written consent was obtained from the patient after discussion of risks, benefits, and alternatives. Patient has consented to proceed with Esophagogastroduodenoscopy and Colonoscopy with possible intervention   01/05/2023 2:09 PM  Joshua Delacruz  has presented today for surgery, with the diagnosis of V12.72 (ICD-9-CM) - Z86.010 (ICD-10-CM) - Personal history of colonic polyps 530.81 (ICD-9-CM) - K21.9 (ICD-10-CM) - Gastroesophageal reflux disease, unspecified whether esophagitis present 787.20 (ICD-9-CM) - R13.10 (ICD-10-CM) - Dysphagia, unspecified type.  The various methods of treatment have been discussed with the patient and family. After consideration of risks, benefits and other options for treatment, the patient has consented to  Procedure(s): COLONOSCOPY WITH PROPOFOL (N/A) ESOPHAGOGASTRODUODENOSCOPY (EGD) WITH PROPOFOL (N/A) as a surgical intervention.  The patient's history has been reviewed, patient examined, no change in status, stable for surgery.  I have reviewed the patient's chart and labs.  Questions were answered to the patient's satisfaction.     Jaynie Collins

## 2023-01-05 NOTE — Anesthesia Postprocedure Evaluation (Signed)
Anesthesia Post Note  Patient: YOUSIF EDELSON  Procedure(s) Performed: COLONOSCOPY WITH PROPOFOL ESOPHAGOGASTRODUODENOSCOPY (EGD) WITH PROPOFOL BIOPSY  Patient location during evaluation: Endoscopy Anesthesia Type: General Level of consciousness: awake and alert Pain management: pain level controlled Vital Signs Assessment: post-procedure vital signs reviewed and stable Respiratory status: spontaneous breathing, nonlabored ventilation, respiratory function stable and patient connected to nasal cannula oxygen Cardiovascular status: blood pressure returned to baseline and stable Postop Assessment: no apparent nausea or vomiting Anesthetic complications: no  No notable events documented.   Last Vitals:  Vitals:   01/05/23 1341 01/05/23 1455  BP: (!) 137/98 91/68  Pulse: (!) 56   Resp: 16 (!) 58  Temp: (!) 36.1 C 36.9 C  SpO2: 99% 99%    Last Pain:  Vitals:   01/05/23 1455  TempSrc: Temporal  PainSc: Asleep                 Stephanie Coup

## 2023-01-05 NOTE — Op Note (Signed)
[facility] Gastroenterology Patient Name: Joshua Delacruz Procedure Date: 01/05/2023 1:59 PM MRN: [id number] Account #: [id number] Date of Birth: 08/16/1955 Admit Type: Outpatient Age: 67 Room: [facility] ENDO ROOM 2 Gender: Male Note Status: Finalized Instrument Name: Prentice Docker [id number] Procedure:             Colonoscopy Indications:           Colon cancer screening in patient at increased risk:                         Family history of colon polyps in multiple 1st-degree                         relatives Providers:             Trenda Moots, DO Referring MD:          Marisue Ivan (Referring MD) Medicines:             Monitored Anesthesia Care Complications:         No immediate complications. Estimated blood loss: None. Procedure:             Pre-Anesthesia Assessment:                        - Prior to the procedure, a History and Physical was                         performed, and patient medications and allergies were                         reviewed. The patient is competent. The risks and                         benefits of the procedure and the sedation options and                         risks were discussed with the patient. All questions                         were answered and informed consent was obtained.                         Patient identification and proposed procedure were                         verified by the physician, the nurse, the anesthetist                         and the technician in the endoscopy suite. Mental                         Status Examination: alert and oriented. Airway                         Examination: normal oropharyngeal airway and neck                         mobility. Respiratory Examination: clear to  auscultation. CV Examination: RRR, no murmurs, no S3                         or S4. Prophylactic Antibiotics: The patient does not                         require prophylactic  antibiotics. Prior                         Anticoagulants: The patient has taken no anticoagulant                         or antiplatelet agents. ASA Grade Assessment: III - A                         patient with severe systemic disease. After reviewing                         the risks and benefits, the patient was deemed in                         satisfactory condition to undergo the procedure. The                         anesthesia plan was to use monitored anesthesia care                         (MAC). Immediately prior to administration of                         medications, the patient was re-assessed for adequacy                         to receive sedatives. The heart rate, respiratory                         rate, oxygen saturations, blood pressure, adequacy of                         pulmonary ventilation, and response to care were                         monitored throughout the procedure. The physical                         status of the patient was re-assessed after the                         procedure.                        After obtaining informed consent, the colonoscope was                         passed under direct vision. Throughout the procedure,                         the patient's blood pressure, pulse, and oxygen  saturations were monitored continuously. The                         Colonoscope was introduced through the anus and                         advanced to the the cecum, identified by appendiceal                         orifice and ileocecal valve. The colonoscopy was                         performed without difficulty. The patient tolerated                         the procedure well. The quality of the bowel                         preparation was evaluated using the BBPS [facility] Bowel                         Preparation Scale) with scores of: Right Colon = 3,                         Transverse Colon = 3 and Left Colon = 3  (entire mucosa                         seen well with no residual staining, small fragments                         of stool or opaque liquid). The total BBPS score                         equals 9. The ileocecal valve, appendiceal orifice,                         and rectum were photographed. Findings:      The perianal and digital rectal examinations were normal. Pertinent       negatives include normal sphincter tone.      Non-bleeding internal hemorrhoids were found during retroflexion. The       hemorrhoids were Grade I (internal hemorrhoids that do not prolapse).       Estimated blood loss: none.      The entire examined colon appeared normal on direct and retroflexion       views. Impression:            - Non-bleeding internal hemorrhoids.                        - The entire examined colon is normal on direct and                         retroflexion views.                        - No specimens collected. Recommendation:        - Patient has a contact number available for  emergencies. The signs and symptoms of potential                         delayed complications were discussed with the patient.                         Return to normal activities tomorrow. Written                         discharge instructions were provided to the patient.                        - Discharge patient to home.                        - Resume previous diet.                        - Continue present medications.                        - Repeat colonoscopy in 5 years for screening purposes.                        - Return to referring physician as previously                         scheduled.                        - The findings and recommendations were discussed with                         the patient. Procedure Code(s):     --- Professional ---                        [phone], Colonoscopy, flexible; diagnostic, including                         collection of specimen(s) by  brushing or washing, when                         performed (separate procedure) Diagnosis Code(s):     --- Professional ---                        Z83.71, Family history of colonic polyps                        K64.0, First degree hemorrhoids CPT copyright 2022 American Medical Association. All rights reserved. The codes documented in this report are preliminary and upon coder review may  be revised to meet current compliance requirements. Attending Participation:      I personally performed the entire procedure. Elfredia Nevins, DO Jaynie Collins DO, DO 01/05/2023 2:55:28 PM This report has been signed electronically. Number of Addenda: 0 Note Initiated On: 01/05/2023 1:59 PM Scope Withdrawal Time: 0 hours 10 minutes 7 seconds  Total Procedure Duration: 0 hours 13 minutes 59 seconds  Estimated Blood Loss:  Estimated blood loss: none.      [facility]

## 2023-01-05 NOTE — H&P (Signed)
Pre-Procedure H&P   Patient ID: Joshua Delacruz is a 67 y.o. male.  Gastroenterology Provider: Jaynie Collins, DO  Referring Provider: Tawni Pummel, PA PCP: Marisue Ivan, MD  Date: 01/05/2023  HPI Joshua Delacruz is a 67 y.o. male who presents today for Esophagogastroduodenoscopy and Colonoscopy for gerd, dysphagia, fhx colon polyps (brother and father).  Patient reports he has a bowel movement every day to every 3 days.  He does note straining without abdominal pain melena or hematochezia.  He notes dysphagia to meats and pills and esophageal burning.  Last underwent EGD and colonoscopy in October 2018 demonstrating gastritis and Schatzki's ring.  This was dilated with a 51 French savary dilator to which she had good response.  Biopsies negative for Barrett's esophagus.  Internal hemorrhoids were noted on colonoscopy  Colonoscopy and EGD in 2013 with the former only demonstrating hyperplastic polyps.  Otherwise unremarkable   Past Medical History:  Diagnosis Date   Acute pancreatitis    H/O   Anxiety    Asthma    COPD (chronic obstructive pulmonary disease) (HCC)    Depression    Diabetes mellitus without complication (HCC)    Hypertension    Myocardial infarction [facility])     Past Surgical History:  Procedure Laterality Date   HERNIA REPAIR     HERNIA REPAIR     LEFT HEART CATH AND CORONARY ANGIOGRAPHY Left 08/11/2022   Procedure: LEFT HEART CATH AND CORONARY ANGIOGRAPHY;  Surgeon: Alwyn Pea, MD;  Location: [facility] INVASIVE CV LAB;  Service: Cardiovascular;  Laterality: Left;   NECK SURGERY     SALIVARY GLAND SURGERY      Family History colon polyps (brother and father) No h/o GI disease or malignancy  Review of Systems  Constitutional:  Negative for activity change, appetite change, chills, diaphoresis, fatigue, fever and unexpected weight change.  HENT:  Positive for trouble swallowing. Negative for voice change.   Respiratory:   Negative for shortness of breath and wheezing.   Cardiovascular:  Negative for chest pain, palpitations and leg swelling.  Gastrointestinal:  Negative for abdominal distention, abdominal pain, anal bleeding, blood in stool, constipation, diarrhea, nausea and vomiting.  Musculoskeletal:  Negative for arthralgias and myalgias.  Skin:  Negative for color change and pallor.  Neurological:  Negative for dizziness, syncope and weakness.  Psychiatric/Behavioral:  Negative for confusion. The patient is not nervous/anxious.   All other systems reviewed and are negative.    Medications No current facility-administered medications on file prior to encounter.   Current Outpatient Medications on File Prior to Encounter  Medication Sig Dispense Refill   ANORO ELLIPTA 62.5-25 MCG/ACT AEPB Inhale 1 puff into the lungs daily.     enalapril (VASOTEC) 10 MG tablet Take 10 mg by mouth daily.     albuterol (VENTOLIN HFA) 108 (90 Base) MCG/ACT inhaler SMARTSIG:2 Puff(s) By Mouth Every 4 Hours PRN     busPIRone (BUSPAR) 10 MG tablet Take 10 mg by mouth 2 (two) times daily.     Dupilumab (DUPIXENT) 300 MG/2ML SOPN Inject 300 mg into the skin See admin instructions. Patient takes every other friday     FLUoxetine (PROZAC) 40 MG capsule Take 40 mg by mouth daily.     glimepiride (AMARYL) 1 MG tablet Take 1 mg by mouth 2 (two) times daily.     ivabradine (CORLANOR) 7.5 MG TABS tablet Take tablets (15mg ) TWO hours prior to your cardiac CT scan. 2 tablet 0   meloxicam (MOBIC)  15 MG tablet Take 1 tablet (15 mg total) by mouth daily. (Patient not taking: Reported on 08/11/2022) 30 tablet 3   metFORMIN (GLUCOPHAGE) 1000 MG tablet Take 1,000 mg by mouth 2 (two) times daily.     methylPREDNISolone (MEDROL DOSEPAK) 4 MG TBPK tablet 6 day dose pack - take as directed 21 tablet 0   metoprolol tartrate (LOPRESSOR) 100 MG tablet Take tablet TWO hours prior to your cardiac CT scan. (Patient not taking: Reported on 08/11/2022) 1  tablet 0   omeprazole (PRILOSEC) 40 MG capsule Take 40 mg by mouth at bedtime.     pravastatin (PRAVACHOL) 20 MG tablet Take 20 mg by mouth at bedtime.     traZODone (DESYREL) 50 MG tablet Take 50 mg by mouth at bedtime.      Pertinent medications related to GI and procedure were reviewed by me with the patient prior to the procedure   Current Facility-Administered Medications:    0.9 %  sodium chloride infusion, , Intravenous, Continuous, Jaynie Collins, DO, Last Rate: 20 mL/hr at 01/05/23 1345, New Bag at 01/05/23 1345  sodium chloride 20 mL/hr at 01/05/23 1345       No Known Allergies Allergies were reviewed by me prior to the procedure  Objective   Body mass index is 24.79 kg/m. Vitals:   01/05/23 1341  BP: (!) 137/98  Pulse: (!) 56  Resp: 16  Temp: (!) 96.9 F (36.1 C)  TempSrc: Temporal  SpO2: 99%  Weight: 82.9 kg  Height: 6' (1.829 m)     Physical Exam Vitals and nursing note reviewed.  Constitutional:      General: He is not in acute distress.    Appearance: Normal appearance. He is not ill-appearing, toxic-appearing or diaphoretic.  HENT:     Head: Normocephalic and atraumatic.     Nose: Nose normal.     Mouth/Throat:     Mouth: Mucous membranes are moist.     Pharynx: Oropharynx is clear.  Eyes:     General: No scleral icterus.    Extraocular Movements: Extraocular movements intact.  Cardiovascular:     Rate and Rhythm: Regular rhythm. Bradycardia present.     Heart sounds: Normal heart sounds. No murmur heard.    No friction rub. No gallop.  Pulmonary:     Effort: Pulmonary effort is normal. No respiratory distress.     Breath sounds: Normal breath sounds. No wheezing, rhonchi or rales.  Abdominal:     General: Bowel sounds are normal. There is no distension.     Palpations: Abdomen is soft.     Tenderness: There is no abdominal tenderness. There is no guarding or rebound.  Musculoskeletal:     Cervical back: Neck supple.     Right  lower leg: No edema.     Left lower leg: No edema.  Skin:    General: Skin is warm and dry.     Coloration: Skin is not jaundiced or pale.  Neurological:     General: No focal deficit present.     Mental Status: He is alert and oriented to person, place, and time. Mental status is at baseline.  Psychiatric:        Mood and Affect: Mood normal.        Behavior: Behavior normal.        Thought Content: Thought content normal.        Judgment: Judgment normal.      Assessment:  Joshua Delacruz is a  67 y.o. male  who presents today for Esophagogastroduodenoscopy and Colonoscopy for dysphagia, gerd, screening with fhx colon polyps (brother and father).  Plan:  Esophagogastroduodenoscopy and Colonoscopy with possible intervention today  Esophagogastroduodenoscopy and Colonoscopy with possible biopsy, control of bleeding, polypectomy, and interventions as necessary has been discussed with the patient/patient representative. Informed consent was obtained from the patient/patient representative after explaining the indication, nature, and risks of the procedure including but not limited to death, bleeding, perforation, missed neoplasm/lesions, cardiorespiratory compromise, and reaction to medications. Opportunity for questions was given and appropriate answers were provided. Patient/patient representative has verbalized understanding is amenable to undergoing the procedure.   Jaynie Collins, DO  [facility] Gastroenterology  Portions of the record may have been created with voice recognition software. Occasional wrong-word or 'sound-a-like' substitutions may have occurred due to the inherent limitations of voice recognition software.  Read the chart carefully and recognize, using context, where substitutions may have occurred.

## 2023-01-05 NOTE — Op Note (Signed)
[facility] Gastroenterology Patient Name: Joshua Delacruz Procedure Date: 01/05/2023 2:02 PM MRN: [id number] Account #: [id number] Date of Birth: 1955/06/28 Admit Type: Outpatient Age: 67 Room: [facility] ENDO ROOM 2 Gender: Male Note Status: Finalized Instrument Name: Upper Endoscope [id number] Procedure:             Upper GI endoscopy Indications:           Dysphagia, Esophageal reflux Providers:             Trenda Moots, DO Referring MD:          Marisue Ivan (Referring MD) Medicines:             Monitored Anesthesia Care Complications:         No immediate complications. Estimated blood loss:                         Minimal. Procedure:             Pre-Anesthesia Assessment:                        - Prior to the procedure, a History and Physical was                         performed, and patient medications and allergies were                         reviewed. The patient is competent. The risks and                         benefits of the procedure and the sedation options and                         risks were discussed with the patient. All questions                         were answered and informed consent was obtained.                         Patient identification and proposed procedure were                         verified by the physician, the nurse, the anesthetist                         and the technician in the endoscopy suite. Mental                         Status Examination: alert and oriented. Airway                         Examination: normal oropharyngeal airway and neck                         mobility. Respiratory Examination: clear to                         auscultation. CV Examination: RRR, no murmurs, no S3  or S4. Prophylactic Antibiotics: The patient does not                         require prophylactic antibiotics. Prior                         Anticoagulants: The patient has taken no anticoagulant                          or antiplatelet agents. ASA Grade Assessment: III - A                         patient with severe systemic disease. After reviewing                         the risks and benefits, the patient was deemed in                         satisfactory condition to undergo the procedure. The                         anesthesia plan was to use monitored anesthesia care                         (MAC). Immediately prior to administration of                         medications, the patient was re-assessed for adequacy                         to receive sedatives. The heart rate, respiratory                         rate, oxygen saturations, blood pressure, adequacy of                         pulmonary ventilation, and response to care were                         monitored throughout the procedure. The physical                         status of the patient was re-assessed after the                         procedure.                        After obtaining informed consent, the endoscope was                         passed under direct vision. Throughout the procedure,                         the patient's blood pressure, pulse, and oxygen                         saturations were monitored continuously. The Endoscope  was introduced through the mouth, and advanced to the                         second part of duodenum. The upper GI endoscopy was                         accomplished without difficulty. The patient tolerated                         the procedure well. Findings:      Localized mild inflammation characterized by erythema was found in the       duodenal bulb. Biopsies were taken with a cold forceps for histology.       Estimated blood loss was minimal.      The exam of the duodenum was otherwise normal.      The entire examined stomach was normal. Biopsies were taken with a cold       forceps for Helicobacter pylori testing. Estimated blood loss was        minimal.      The Z-line was regular. Estimated blood loss: none.      Esophagogastric landmarks were identified: the gastroesophageal junction       was found at 43 cm from the incisors.      A widely patent Schatzki ring was found at the gastroesophageal       junction. A TTS dilator was passed through the scope. Dilation with a       15-16.5-18 mm balloon dilator was performed to 15 mm, 16.5 mm and 18 mm.       The dilation site was examined following endoscope reinsertion and       showed no change. Estimated blood loss: none.      Normal mucosa was found in the entire esophagus. Biopsies were obtained       from the proximal and distal esophagus with cold forceps for histology       of suspected eosinophilic esophagitis. Estimated blood loss was minimal.      The lower third of the esophagus was moderately tortuous. Estimated       blood loss: none.      Abnormal motility was noted in the lower third of the esophagus. The       cricopharyngeus was normal. There is spasticity of the esophageal body.       The distal esophagus/lower esophageal sphincter is open. Tertiary       peristaltic waves are noted. Estimated blood loss: none.      The exam of the esophagus was otherwise normal. Impression:            - Duodenitis. Biopsied.                        - Normal stomach. Biopsied.                        - Z-line regular.                        - Esophagogastric landmarks identified.                        - Widely patent Schatzki ring. Dilated.                        -  Normal mucosa was found in the entire esophagus.                        - Tortuous esophagus.                        - Abnormal esophageal motility, suspicious for                         presbyesophagus.                        - Biopsies were taken with a cold forceps for                         evaluation of eosinophilic esophagitis. Recommendation:        - Patient has a contact number available for                          emergencies. The signs and symptoms of potential                         delayed complications were discussed with the patient.                         Return to normal activities tomorrow. Written                         discharge instructions were provided to the patient.                        - Discharge patient to home.                        - Resume previous diet.                        - Continue present medications.                        - Await pathology results.                        - Repeat upper endoscopy PRN for retreatment.                        - Return to GI office as previously scheduled.                        - proceed with colonoscopy                        - The findings and recommendations were discussed with                         the patient. Procedure Code(s):     --- Professional ---                        [phone], Esophagogastroduodenoscopy, flexible,  transoral; with transendoscopic balloon dilation of                         esophagus (less than 30 mm diameter)                        43239, 59, Esophagogastroduodenoscopy, flexible,                         transoral; with biopsy, single or multiple Diagnosis Code(s):     --- Professional ---                        K29.80, Duodenitis without bleeding                        K22.2, Esophageal obstruction                        Q39.9, Congenital malformation of esophagus,                         unspecified                        K22.4, Dyskinesia of esophagus                        R13.10, Dysphagia, unspecified                        K21.9, Gastro-esophageal reflux disease without                         esophagitis CPT copyright 2022 American Medical Association. All rights reserved. The codes documented in this report are preliminary and upon coder review may  be revised to meet current compliance requirements. Attending Participation:      I personally performed the  entire procedure. Elfredia Nevins, DO Jaynie Collins DO, DO 01/05/2023 2:35:55 PM This report has been signed electronically. Number of Addenda: 0 Note Initiated On: 01/05/2023 2:02 PM Estimated Blood Loss:  Estimated blood loss was minimal.      [facility]

## 2023-01-05 NOTE — Anesthesia Procedure Notes (Signed)
Procedure Name: MAC Date/Time: 01/05/2023 2:15 PM  Performed by: Cheral Bay, CRNAPre-anesthesia Checklist: Patient identified, Emergency Drugs available, Suction available, Patient being monitored and Timeout performed Patient Re-evaluated:Patient Re-evaluated prior to induction Oxygen Delivery Method: Nasal cannula Induction Type: IV induction Placement Confirmation: positive ETCO2 and CO2 detector

## 2023-01-06 ENCOUNTER — Encounter: Payer: Self-pay | Admitting: Gastroenterology

## 2023-11-18 ENCOUNTER — Other Ambulatory Visit: Payer: Self-pay | Admitting: Pulmonary Disease

## 2023-11-18 DIAGNOSIS — R911 Solitary pulmonary nodule: Secondary | ICD-10-CM

## 2023-11-23 ENCOUNTER — Ambulatory Visit
Admission: RE | Admit: 2023-11-23 | Discharge: 2023-11-23 | Disposition: A | Discharge status: Home / Self Care | Source: Ambulatory Visit | Attending: Pulmonary Disease | Admitting: Pulmonary Disease

## 2023-11-23 DIAGNOSIS — R911 Solitary pulmonary nodule: Secondary | ICD-10-CM | POA: Diagnosis present

## 2024-05-18 ENCOUNTER — Other Ambulatory Visit: Payer: Self-pay | Admitting: Physical Medicine & Rehabilitation

## 2024-05-18 DIAGNOSIS — G8929 Other chronic pain: Secondary | ICD-10-CM

## 2024-05-18 DIAGNOSIS — M5416 Radiculopathy, lumbar region: Secondary | ICD-10-CM

## 2024-05-24 ENCOUNTER — Inpatient Hospital Stay
Admission: RE | Admit: 2024-05-24 | Discharge: 2024-05-24 | Attending: Physical Medicine & Rehabilitation | Admitting: Physical Medicine & Rehabilitation

## 2024-05-24 DIAGNOSIS — M5416 Radiculopathy, lumbar region: Secondary | ICD-10-CM

## 2024-05-24 DIAGNOSIS — G8929 Other chronic pain: Secondary | ICD-10-CM
